# Patient Record
Sex: Female | Born: 1955 | Race: White | Hispanic: No | Marital: Married | Smoking: Never smoker
Health system: Southern US, Community
[De-identification: ages and names within clinical notes are randomized; demographics above are authoritative.]

## PROBLEM LIST (undated history)

## (undated) DIAGNOSIS — E7211 Homocystinuria: Secondary | ICD-10-CM

## (undated) HISTORY — PX: OTHER SURGICAL HISTORY: SHX169

## (undated) HISTORY — PX: SHOULDER SURGERY: SHX246

## (undated) HISTORY — DX: Homocystinuria: E72.11

## (undated) HISTORY — PX: TUBAL LIGATION: SHX77

---

## 2010-08-05 ENCOUNTER — Encounter: Payer: Self-pay | Admitting: Internal Medicine

## 2014-06-25 ENCOUNTER — Ambulatory Visit: Payer: Self-pay | Admitting: Cardiovascular Disease

## 2014-07-30 ENCOUNTER — Ambulatory Visit: Payer: Self-pay | Admitting: Cardiovascular Disease

## 2014-08-12 ENCOUNTER — Ambulatory Visit: Payer: Self-pay | Admitting: Cardiology

## 2015-04-29 DIAGNOSIS — M1712 Unilateral primary osteoarthritis, left knee: Secondary | ICD-10-CM | POA: Insufficient documentation

## 2016-02-19 ENCOUNTER — Other Ambulatory Visit: Payer: Self-pay | Admitting: Physical Medicine and Rehabilitation

## 2016-02-19 DIAGNOSIS — M7122 Synovial cyst of popliteal space [Baker], left knee: Secondary | ICD-10-CM

## 2016-02-19 DIAGNOSIS — M6240 Contracture of muscle, unspecified site: Secondary | ICD-10-CM

## 2016-02-24 ENCOUNTER — Other Ambulatory Visit: Payer: Self-pay

## 2016-03-02 ENCOUNTER — Ambulatory Visit
Admission: RE | Admit: 2016-03-02 | Discharge: 2016-03-02 | Disposition: A | Discharge status: Home / Self Care | Payer: Managed Care, Other (non HMO) | Source: Ambulatory Visit | Attending: Physical Medicine and Rehabilitation | Admitting: Physical Medicine and Rehabilitation

## 2016-03-02 DIAGNOSIS — M7122 Synovial cyst of popliteal space [Baker], left knee: Secondary | ICD-10-CM

## 2016-03-02 DIAGNOSIS — M6240 Contracture of muscle, unspecified site: Secondary | ICD-10-CM

## 2016-03-18 DIAGNOSIS — J309 Allergic rhinitis, unspecified: Secondary | ICD-10-CM | POA: Insufficient documentation

## 2016-03-18 DIAGNOSIS — H905 Unspecified sensorineural hearing loss: Secondary | ICD-10-CM | POA: Insufficient documentation

## 2016-03-18 DIAGNOSIS — H6980 Other specified disorders of Eustachian tube, unspecified ear: Secondary | ICD-10-CM | POA: Insufficient documentation

## 2017-01-07 DIAGNOSIS — J40 Bronchitis, not specified as acute or chronic: Secondary | ICD-10-CM | POA: Insufficient documentation

## 2017-01-07 DIAGNOSIS — K219 Gastro-esophageal reflux disease without esophagitis: Secondary | ICD-10-CM | POA: Insufficient documentation

## 2018-02-25 DIAGNOSIS — J069 Acute upper respiratory infection, unspecified: Secondary | ICD-10-CM | POA: Insufficient documentation

## 2018-02-25 DIAGNOSIS — B9789 Other viral agents as the cause of diseases classified elsewhere: Secondary | ICD-10-CM

## 2018-10-02 ENCOUNTER — Ambulatory Visit: Payer: Managed Care, Other (non HMO) | Admitting: Podiatry

## 2018-12-04 ENCOUNTER — Ambulatory Visit (INDEPENDENT_AMBULATORY_CARE_PROVIDER_SITE_OTHER): Payer: Managed Care, Other (non HMO)

## 2018-12-04 ENCOUNTER — Other Ambulatory Visit: Payer: Self-pay | Admitting: Podiatry

## 2018-12-04 ENCOUNTER — Encounter: Payer: Self-pay | Admitting: Podiatry

## 2018-12-04 ENCOUNTER — Ambulatory Visit (INDEPENDENT_AMBULATORY_CARE_PROVIDER_SITE_OTHER): Payer: Managed Care, Other (non HMO) | Admitting: Podiatry

## 2018-12-04 VITALS — BP 137/76 | HR 64

## 2018-12-04 DIAGNOSIS — M7661 Achilles tendinitis, right leg: Secondary | ICD-10-CM

## 2018-12-04 DIAGNOSIS — M79671 Pain in right foot: Secondary | ICD-10-CM

## 2018-12-04 DIAGNOSIS — M79672 Pain in left foot: Secondary | ICD-10-CM

## 2018-12-04 DIAGNOSIS — M7662 Achilles tendinitis, left leg: Secondary | ICD-10-CM | POA: Diagnosis not present

## 2018-12-04 MED ORDER — PREDNISONE 10 MG PO TABS: ORAL_TABLET | ORAL | 0 refills | Status: DC

## 2018-12-04 MED ORDER — TRIAMCINOLONE ACETONIDE 10 MG/ML IJ SUSP: 10.0000 mg | Freq: Once | INTRAMUSCULAR | Status: AC

## 2018-12-04 NOTE — Patient Instructions (Signed)

## 2018-12-06 NOTE — Progress Notes (Signed)
Subjective:   Patient ID: Elizabeth Fuentes, female   DOB: 62 y.o.   MRN: [id number]   HPI Patient states she is developed a lot of pain in the back of the left heel and slightly in the right and does not remember specific injury.  States it is hard for her to be active with it and its been very sore and it is really only occurred over the last few weeks with again no history of injury.  Patient does not smoke and likes to be active   Review of Systems  All other systems reviewed and are negative.       Objective:  Physical Exam Vitals signs and nursing note reviewed.  Constitutional:      Appearance: She is well-developed.  Pulmonary:     Effort: Pulmonary effort is normal.  Musculoskeletal: Normal range of motion.  Skin:    General: Skin is warm.  Neurological:     Mental Status: She is alert.     Neurovascular status found to be intact muscle strength is adequate with inflammation pain of the medial side of the left heel at the insertional point of the Achilles tendon into the calcaneus.  There is no indications of muscle strength loss and the right one also has moderate discomfort but not to the same degree and patient was noted to have good digital perfusion and well oriented x3     Assessment:  Acute Achilles tendinitis left is most likely local but cannot rule out systemic cause with moderate pain in the right     Plan:  H&P condition reviewed and at this point I am get a do a careful injection after first explaining to patient the chances for rupture associated with it.  Patient wants injection and I did carefully inject the medial side keep it away from the central and lateral side with 3 mg dexamethasone Kenalog 5 mg Xylocaine and advised on reduced activity ice therapy.  Reappoint to recheck again in the next several weeks  X-rays indicate small spur formation with no indications of stress fracture or arthritis

## 2018-12-25 ENCOUNTER — Ambulatory Visit (INDEPENDENT_AMBULATORY_CARE_PROVIDER_SITE_OTHER): Payer: Managed Care, Other (non HMO) | Admitting: Podiatry

## 2018-12-25 ENCOUNTER — Encounter: Payer: Self-pay | Admitting: Podiatry

## 2018-12-25 DIAGNOSIS — M7661 Achilles tendinitis, right leg: Secondary | ICD-10-CM

## 2018-12-25 DIAGNOSIS — M7662 Achilles tendinitis, left leg: Secondary | ICD-10-CM | POA: Diagnosis not present

## 2018-12-26 ENCOUNTER — Telehealth: Payer: Self-pay | Admitting: Podiatry

## 2018-12-26 NOTE — Telephone Encounter (Signed)
Pt called about the orthotics she was casted for yesterday. She is asking to hold off for now due to insurance and deductible is very high. She does have a pair from several years ago and will try to wear them and see if they can help. I did explain that if there is an issue with them that she maybe able to see Liliane Channel and see if there is anything he can do for her or possibly refurbish them. But she would need an appt and if she wanted she maybe able to see Liliane Channel on the same day of her appt with Dr Paulla Dolly in February.

## 2018-12-27 NOTE — Progress Notes (Signed)
Subjective:   Patient ID: Elizabeth Fuentes, female   DOB: 63 y.o.   MRN: [id number]   HPI Patient presents stating I am moderately improved but I still having a lot of pain in my heel and it seems to be continuing on the left with mild pain in the right   ROS      Objective:  Physical Exam  Neurovascular status intact no changes in muscle strength with patient continue to exhibit discomfort posterior aspect heel medial side left over right at the insertion Achilles calcaneus     Assessment:  Continuing to exhibit moderate discomfort at the insertion of the Achilles into the calcaneus left over right with moderate equinus and flatfoot gait structure     Plan:  H&P discussed condition and discussed treatment options including surgical intervention shockwave therapy or continued conservative treatment.  She is opted for conservative and I recommended orthotics with heel lift to try to keep pressure off the heel and I recommended ice therapy and dispensed Achilles sleeve to try to take pressure off the posterior heel region.  Patient will be careful with her shoe gear choices and will be seen back to recheck

## 2019-01-31 ENCOUNTER — Ambulatory Visit: Payer: Managed Care, Other (non HMO) | Admitting: Podiatry

## 2020-02-16 LAB — COLOGUARD: COLOGUARD: NEGATIVE

## 2020-07-23 ENCOUNTER — Other Ambulatory Visit: Payer: Self-pay

## 2020-07-23 ENCOUNTER — Encounter: Payer: Self-pay | Admitting: Dermatology

## 2020-07-23 ENCOUNTER — Ambulatory Visit (INDEPENDENT_AMBULATORY_CARE_PROVIDER_SITE_OTHER): Payer: Managed Care, Other (non HMO) | Admitting: Dermatology

## 2020-07-23 DIAGNOSIS — D485 Neoplasm of uncertain behavior of skin: Secondary | ICD-10-CM | POA: Diagnosis not present

## 2020-07-23 DIAGNOSIS — D229 Melanocytic nevi, unspecified: Secondary | ICD-10-CM

## 2020-07-23 DIAGNOSIS — D225 Melanocytic nevi of trunk: Secondary | ICD-10-CM | POA: Diagnosis not present

## 2020-07-23 NOTE — Patient Instructions (Signed)

## 2020-09-06 ENCOUNTER — Encounter: Payer: Self-pay | Admitting: Dermatology

## 2020-09-06 NOTE — Progress Notes (Signed)
   New Patient   Subjective  Elizabeth Fuentes is a 64 y.o. female who presents for the following: New Patient (Initial Visit) (DOS 3+ years--skin check and face --discoloration, redness.).  Growth Location:  Duration:  Quality:  Associated Signs/Symptoms: Modifying Factors:  Severity:  Timing: Context:    The following portions of the chart were reviewed this encounter and updated as appropriate: Tobacco  Allergies  Meds  Problems  Med Hx  Surg Hx  Fam Hx      Objective  Well appearing patient in no apparent distress; mood and affect are within normal limits.  All sun exposed areas plus back examined. Plus chest.   Assessment & Plan  Neoplasm of uncertain behavior of skin Right Breast  Skin / nail biopsy Type of biopsy: tangential   Informed consent: discussed and consent obtained   Timeout: patient name, date of birth, surgical site, and procedure verified   Procedure prep:  Patient was prepped and draped in usual sterile fashion Prep type:  Chlorhexidine Anesthesia: the lesion was anesthetized in a standard fashion   Anesthetic:  1% lidocaine w/ epinephrine 1-100,000 local infiltration Instrument used: flexible razor blade   Hemostasis achieved with: ferric subsulfate   Outcome: patient tolerated procedure well   Post-procedure details: wound care instructions given    Specimen 1 - Surgical pathology Differential Diagnosis: r/o wart, tag Check Margins: No  Nevus Mid Back  Annual skin examination

## 2020-10-06 ENCOUNTER — Telehealth: Payer: Self-pay | Admitting: Dermatology

## 2020-10-06 NOTE — Telephone Encounter (Signed)
Says on August 4 ST removed a wart and the nurse wadded it up and threw it in the trash but she was charged for a biopsy.

## 2020-10-07 NOTE — Telephone Encounter (Signed)
Patient aware results and specimen was sent to the lab. She thought it was just snipped and no sent I advised patient when it is a biopsy we get photo consent &  it is sent to the lab. Patient understands

## 2020-10-09 ENCOUNTER — Encounter: Payer: Self-pay | Admitting: Internal Medicine

## 2020-10-09 ENCOUNTER — Ambulatory Visit (INDEPENDENT_AMBULATORY_CARE_PROVIDER_SITE_OTHER): Payer: Managed Care, Other (non HMO) | Admitting: Internal Medicine

## 2020-10-09 ENCOUNTER — Other Ambulatory Visit: Payer: Self-pay

## 2020-10-09 VITALS — BP 128/82 | HR 61 | Ht 62.0 in | Wt 158.4 lb

## 2020-10-09 DIAGNOSIS — Z8249 Family history of ischemic heart disease and other diseases of the circulatory system: Secondary | ICD-10-CM

## 2020-10-09 DIAGNOSIS — Z7189 Other specified counseling: Secondary | ICD-10-CM | POA: Diagnosis not present

## 2020-10-09 NOTE — Patient Instructions (Signed)
Medication Instructions:  No Changes In Medications at this time.  *If you need a refill on your cardiac medications before your next appointment, please call your pharmacy*  Lab Work: None Ordered At This Time.  If you have labs (blood work) drawn today and your tests are completely normal, you will receive your results only by: Marland Kitchen MyChart Message (if you have MyChart) OR . A paper copy in the mail If you have any lab test that is abnormal or we need to change your treatment, we will call you to review the results.  Testing/Procedures: Dr. Margaretann Loveless has ordered a CT coronary calcium score. This test is done at [address]. This is $150 out of pocket.   Coronary CalciumScan A coronary calcium scan is an imaging test used to look for deposits of calcium and other fatty materials (plaques) in the inner lining of the blood vessels of the heart (coronary arteries). These deposits of calcium and plaques can partly clog and narrow the coronary arteries without producing any symptoms or warning signs. This puts a person at risk for a heart attack. This test can detect these deposits before symptoms develop. Tell a health care provider about:  Any allergies you have.  All medicines you are taking, including vitamins, herbs, eye drops, creams, and over-the-counter medicines.  Any problems you or family members have had with anesthetic medicines.  Any blood disorders you have.  Any surgeries you have had.  Any medical conditions you have.  Whether you are pregnant or may be pregnant. What are the risks? Generally, this is a safe procedure. However, problems may occur, including:  Harm to a pregnant woman and her unborn baby. This test involves the use of radiation. Radiation exposure can be dangerous to a pregnant woman and her unborn baby. If you are pregnant, you generally should not have this procedure done.  Slight increase in the risk of cancer. This is because of the  radiation involved in the test. What happens before the procedure? No preparation is needed for this procedure. What happens during the procedure?  You will undress and remove any jewelry around your neck or chest.  You will put on a hospital gown.  Sticky electrodes will be placed on your chest. The electrodes will be connected to an electrocardiogram (ECG) machine to record a tracing of the electrical activity of your heart.  A CT scanner will take pictures of your heart. During this time, you will be asked to lie still and hold your breath for 2-3 seconds while a picture of your heart is being taken. The procedure may vary among health care providers and hospitals. What happens after the procedure?  You can get dressed.  You can return to your normal activities.  It is up to you to get the results of your test. Ask your health care provider, or the department that is doing the test, when your results will be ready. Summary  A coronary calcium scan is an imaging test used to look for deposits of calcium and other fatty materials (plaques) in the inner lining of the blood vessels of the heart (coronary arteries).  Generally, this is a safe procedure. Tell your health care provider if you are pregnant or may be pregnant.  No preparation is needed for this procedure.  A CT scanner will take pictures of your heart.  You can return to your normal activities after the scan is done. This information is not intended to replace  advice given to you by your health care provider. Make sure you discuss any questions you have with your health care provider. Document Released: 06/03/2008 Document Revised: 10/25/2016 Document Reviewed: 10/25/2016 Elsevier Interactive Patient Education  2017 [place]: At [facility], you and your health needs are our priority.  As part of our continuing mission to provide you with exceptional heart care, we have created designated Provider  Care Teams.  These Care Teams include your primary Cardiologist (physician) and Advanced Practice Providers (APPs -  Physician Assistants and Nurse Practitioners) who all work together to provide you with the care you need, when you need it.  We recommend signing up for the patient portal called "MyChart".  Sign up information is provided on this After Visit Summary.  MyChart is used to connect with patients for Virtual Visits (Telemedicine).  Patients are able to view lab/test results, encounter notes, upcoming appointments, etc.  Non-urgent messages can be sent to your provider as well.   To learn more about what you can do with MyChart, go to [email or web address].    Your next appointment:   1 month(s)  The format for your next appointment:   In Person  Provider:   Cherlynn Kaiser, MD

## 2020-10-09 NOTE — Progress Notes (Signed)
Cardiology Office Note:    Date:  10/09/2020   ID:  Elizabeth Fuentes, DOB 10-23-1956, MRN [id number]  PCP:  Nickola Major, MD  Cardiologist:  No primary care provider on file.  Electrophysiologist:  None   Referring MD: Brien Few, MD   Chief Complaint/Reason for Referral: Family history coronary artery disease  History of Present Illness:    Elizabeth Fuentes is a 64 y.o. female with a history of chest pressure with normal stress nuclear study with excellent exercise capacity in 2004 and 2011, and strong family history of coronary artery disease who presents for risk stratification. Last seen by cardiology 2011.  She denies chest pain, chest pressure, dyspnea at rest or with exertion, palpitations, PND, orthopnea, or leg swelling. Denies cough, fever, chills. Denies nausea, vomiting. Denies syncope or presyncope. Denies dizziness or lightheadedness. Denies snoring.  BP noted as elevated initially but usually runs 118/70-80, and on repeat in office was normal.  FHx: Father MI 69.  Sisters afib Oldest brother aorta replaced. MIs before. Smoker  Past Medical History:  Diagnosis Date  . 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 removal kidney stone    . SHOULDER SURGERY    . TUBAL LIGATION      Current Medications: Current Meds  Medication Sig  . Turmeric 400 MG CAPS Take by mouth.   Current Facility-Administered Medications for the 10/09/20 encounter (Office Visit) with Elouise Munroe, MD  Medication  . triamcinolone acetonide (KENALOG) 10 MG/ML injection 10 mg     Allergies:   Sulfamethoxazole   Social History   Tobacco Use  . Smoking status: Never Smoker  . Smokeless tobacco: Never Used  Substance Use Topics  . Alcohol use: Never  . Drug use: Never     Family History: The patient's family history is not on file.  ROS:   Please see the history of present illness.    All other systems reviewed and are  negative.  EKGs/Labs/Other Studies Reviewed:    The following studies were reviewed today:  EKG:  NSR  Recent Labs: No results found for requested labs within last 8760 hours.  Recent Lipid Panel No results found for: CHOL, TRIG, HDL, CHOLHDL, VLDL, LDLCALC, LDLDIRECT  Physical Exam:    VS:  BP 128/82   Pulse 61   Ht 5\' 2"  (1.575 m)   Wt 158 lb 6.4 oz (71.8 kg)   BMI 28.97 kg/m     Wt Readings from Last 5 Encounters:     10/09/20 158 lb 6.4 oz (71.8 kg)    Constitutional: No acute distress Eyes: sclera non-icteric, normal conjunctiva and lids ENMT: normal dentition, moist mucous membranes Cardiovascular: regular rhythm, normal rate, no murmurs. S1 and S2 normal. Radial pulses normal bilaterally. No jugular venous distention.  Respiratory: clear to auscultation bilaterally GI : normal bowel sounds, soft and nontender. No distention.   MSK: extremities warm, well perfused. No edema.  NEURO: grossly nonfocal exam, moves all extremities. PSYCH: alert and oriented x 3, normal mood and affect.   ASSESSMENT:    1. Family history of heart disease   2. Cardiac risk counseling   3. Family history of ASCVD    PLAN:    Family history of heart disease - Plan: CT CARDIAC SCORING, EKG 12-Lead  Cardiac risk counseling  Family history of ASCVD   We will initiate screening with a coronary calcium scan. LDL is 92 on last check in 2019, follows with  PCP. If calcium score negative, recommend aggressive risk factor modification, her current ASCVD risk is 5.2% which is borderline. Discussed healthy lifestyle and recommend moderate daily exercise, goal of 150 minutes per week per AHA guidelines.  Total time of encounter: 40 minutes total time of encounter, including 25 minutes spent in face-to-face patient care on the date of this encounter. This time includes coordination of care and counseling regarding above mentioned problem list. Remainder of non-face-to-face time involved reviewing  chart documents/testing relevant to the patient encounter and documentation in the medical record. I have independently reviewed documentation from referring provider.   Cherlynn Kaiser, MD [place]  [facility]    Medication Adjustments/Labs and Tests Ordered: Current medicines are reviewed at length with the patient today.  Concerns regarding medicines are outlined above.   Orders Placed This Encounter  Procedures  . CT CARDIAC SCORING  . EKG 12-Lead    No orders of the defined types were placed in this encounter.   Patient Instructions  Medication Instructions:  No Changes In Medications at this time.  *If you need a refill on your cardiac medications before your next appointment, please call your pharmacy*  Lab Work: None Ordered At This Time.  If you have labs (blood work) drawn today and your tests are completely normal, you will receive your results only by: Marland Kitchen MyChart Message (if you have MyChart) OR . A paper copy in the mail If you have any lab test that is abnormal or we need to change your treatment, we will call you to review the results.  Testing/Procedures: Dr. Margaretann Loveless has ordered a CT coronary calcium score. This test is done at [address]. This is $150 out of pocket.   Coronary CalciumScan A coronary calcium scan is an imaging test used to look for deposits of calcium and other fatty materials (plaques) in the inner lining of the blood vessels of the heart (coronary arteries). These deposits of calcium and plaques can partly clog and narrow the coronary arteries without producing any symptoms or warning signs. This puts a person at risk for a heart attack. This test can detect these deposits before symptoms develop. Tell a health care provider about:  Any allergies you have.  All medicines you are taking, including vitamins, herbs, eye drops, creams, and over-the-counter medicines.  Any problems you or family members have had with  anesthetic medicines.  Any blood disorders you have.  Any surgeries you have had.  Any medical conditions you have.  Whether you are pregnant or may be pregnant. What are the risks? Generally, this is a safe procedure. However, problems may occur, including:  Harm to a pregnant woman and her unborn baby. This test involves the use of radiation. Radiation exposure can be dangerous to a pregnant woman and her unborn baby. If you are pregnant, you generally should not have this procedure done.  Slight increase in the risk of cancer. This is because of the radiation involved in the test. What happens before the procedure? No preparation is needed for this procedure. What happens during the procedure?  You will undress and remove any jewelry around your neck or chest.  You will put on a hospital gown.  Sticky electrodes will be placed on your chest. The electrodes will be connected to an electrocardiogram (ECG) machine to record a tracing of the electrical activity of your heart.  A CT scanner will take pictures of your heart. During this time, you will be  asked to lie still and hold your breath for 2-3 seconds while a picture of your heart is being taken. The procedure may vary among health care providers and hospitals. What happens after the procedure?  You can get dressed.  You can return to your normal activities.  It is up to you to get the results of your test. Ask your health care provider, or the department that is doing the test, when your results will be ready. Summary  A coronary calcium scan is an imaging test used to look for deposits of calcium and other fatty materials (plaques) in the inner lining of the blood vessels of the heart (coronary arteries).  Generally, this is a safe procedure. Tell your health care provider if you are pregnant or may be pregnant.  No preparation is needed for this procedure.  A CT scanner will take pictures of your heart.  You can  return to your normal activities after the scan is done. This information is not intended to replace advice given to you by your health care provider. Make sure you discuss any questions you have with your health care provider. Document Released: 06/03/2008 Document Revised: 10/25/2016 Document Reviewed: 10/25/2016 Elsevier Interactive Patient Education  2017 [place]: At [facility], you and your health needs are our priority.  As part of our continuing mission to provide you with exceptional heart care, we have created designated Provider Care Teams.  These Care Teams include your primary Cardiologist (physician) and Advanced Practice Providers (APPs -  Physician Assistants and Nurse Practitioners) who all work together to provide you with the care you need, when you need it.  We recommend signing up for the patient portal called "MyChart".  Sign up information is provided on this After Visit Summary.  MyChart is used to connect with patients for Virtual Visits (Telemedicine).  Patients are able to view lab/test results, encounter notes, upcoming appointments, etc.  Non-urgent messages can be sent to your provider as well.   To learn more about what you can do with MyChart, go to [email or web address].    Your next appointment:   1 month(s)  The format for your next appointment:   In Person  Provider:   Cherlynn Kaiser, MD

## 2020-10-28 ENCOUNTER — Ambulatory Visit (INDEPENDENT_AMBULATORY_CARE_PROVIDER_SITE_OTHER)
Admission: RE | Admit: 2020-10-28 | Discharge: 2020-10-28 | Disposition: A | Discharge status: Home / Self Care | Payer: Self-pay | Source: Ambulatory Visit | Attending: Internal Medicine | Admitting: Internal Medicine

## 2020-10-28 ENCOUNTER — Other Ambulatory Visit: Payer: Self-pay

## 2020-10-28 DIAGNOSIS — Z8249 Family history of ischemic heart disease and other diseases of the circulatory system: Secondary | ICD-10-CM

## 2020-11-05 ENCOUNTER — Encounter: Payer: Self-pay | Admitting: Internal Medicine

## 2020-11-05 ENCOUNTER — Ambulatory Visit (INDEPENDENT_AMBULATORY_CARE_PROVIDER_SITE_OTHER): Payer: Managed Care, Other (non HMO) | Admitting: Internal Medicine

## 2020-11-05 ENCOUNTER — Ambulatory Visit: Payer: Managed Care, Other (non HMO) | Admitting: Internal Medicine

## 2020-11-05 ENCOUNTER — Other Ambulatory Visit: Payer: Self-pay

## 2020-11-05 VITALS — BP 138/82 | HR 75 | Ht 62.0 in | Wt 159.6 lb

## 2020-11-05 DIAGNOSIS — Z7189 Other specified counseling: Secondary | ICD-10-CM | POA: Diagnosis not present

## 2020-11-05 DIAGNOSIS — Z8249 Family history of ischemic heart disease and other diseases of the circulatory system: Secondary | ICD-10-CM | POA: Diagnosis not present

## 2020-11-05 NOTE — Progress Notes (Signed)
Cardiology Office Note:    Date:  11/05/2020   ID:  Elizabeth Fuentes, DOB 11/09/1956, MRN [id number]  PCP:  Nickola Major, MD  Cardiologist:  No primary care provider on file.  Electrophysiologist:  None   Referring MD: Nickola Major, MD   Chief Complaint/Reason for Referral: Follow up Fhx of CAD  History of Present Illness:    Elizabeth Fuentes is a 64 y.o. female with a history of history of chest pressure with normal stress nuclear study with excellent exercise capacity in 2004 and 2011, and strong family history of coronary artery disease who presents for risk stratification. Last seen 10/09/20 by me for initial consultation.  No cp.  No sob  Brother had aorta replacement, recovering slowly.   Discussed results of Cor Cal CT, no coronary calcium and normal size of visualized aorta. Tiny bilateral pulmonary nodules noted, she is low risk and no follow up imaging is required by Fleischner criteria. She had covid in January '21, she wonders if this is related. Unclear given no prior imaging to review. Recommend monitoring clinically.     Past Medical History:  Diagnosis Date  . 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 removal kidney stone    . SHOULDER SURGERY    . TUBAL LIGATION      Current Medications: Current Meds  Medication Sig  . Turmeric 400 MG CAPS Take by mouth.   Current Facility-Administered Medications for the 11/05/20 encounter (Office Visit) with Elouise Munroe, MD  Medication  . triamcinolone acetonide (KENALOG) 10 MG/ML injection 10 mg     Allergies:   Sulfamethoxazole   Social History   Tobacco Use  . Smoking status: Never Smoker  . Smokeless tobacco: Never Used  Substance Use Topics  . Alcohol use: Never  . Drug use: Never     Family History: The patient's family history is not on file.  ROS:   Please see the history of present illness.    All other systems reviewed and are  negative.  EKGs/Labs/Other Studies Reviewed:    The following studies were reviewed today:   Recent Labs: No results found for requested labs within last 8760 hours.  Recent Lipid Panel No results found for: CHOL, TRIG, HDL, CHOLHDL, VLDL, LDLCALC, LDLDIRECT  Physical Exam:    VS:  BP 138/82   Pulse 75   Ht 5\' 2"  (1.575 m)   Wt 159 lb 9.6 oz (72.4 kg)   SpO2 100%   BMI 29.19 kg/m     Wt Readings from Last 5 Encounters:  11/05/20 159 lb 9.6 oz (72.4 kg)  10/09/20 158 lb 6.4 oz (71.8 kg)    Constitutional: No acute distress Eyes: sclera non-icteric, normal conjunctiva and lids ENMT: normal dentition, moist mucous membranes Cardiovascular: regular rhythm, normal rate, no murmurs. S1 and S2 normal. Radial pulses normal bilaterally. No jugular venous distention.  Respiratory: clear to auscultation bilaterally GI : normal bowel sounds, soft and nontender. No distention.   MSK: extremities warm, well perfused. No edema.  NEURO: grossly nonfocal exam, moves all extremities. PSYCH: alert and oriented x 3, normal mood and affect.   ASSESSMENT:    1. Family history of ASCVD   2. Cardiac risk counseling    PLAN:    Family history of ASCVD  Cardiac risk counseling   No coronary calcifications. Recommend aggressive risk reduction with lifestyle recommendations as follows below. No current strong indication for lipid lowering therapy, would  aim for goal LDL < 70. ASCVD risk is 5.2%.  Exercise recommendations: Goal of exercising for at least 30 minutes a day, at least 5 times per week.  Please exercise to a moderate exertion.  This means that while exercising it is difficult to speak in full sentences, however you are not so short of breath that you feel you must stop, and not so comfortable that you can carry on a full conversation.  Exertion level should be approximately a 5/10, if 10 is the most exertion you can perform.  Diet recommendations: Recommend a heart healthy diet  such as the Mediterranean diet.  This diet consists of plant based foods, healthy fats, lean meats, olive oil.  It suggests limiting the intake of simple carbohydrates such as white breads, pastries, and pastas.  It also limits the amount of red meat, wine, and dairy products such as cheese that one should consume on a daily basis.   Total time of encounter: 21 minutes total time of encounter, including 15 minutes spent in face-to-face patient care on the date of this encounter. This time includes coordination of care and counseling regarding above mentioned problem list. Remainder of non-face-to-face time involved reviewing chart documents/testing relevant to the patient encounter and documentation in the medical record. I have independently reviewed documentation from referring provider.   Cherlynn Kaiser, MD [place]  [facility]    Medication Adjustments/Labs and Tests Ordered: Current medicines are reviewed at length with the patient today.  Concerns regarding medicines are outlined above.   No orders of the defined types were placed in this encounter.   No orders of the defined types were placed in this encounter.   Patient Instructions  Medication Instructions:  No Changes In Medications at this time.  *If you need a refill on your cardiac medications before your next appointment, please call your pharmacy*  Lab Work: None Ordered At This Time.  If you have labs (blood work) drawn today and your tests are completely normal, you will receive your results only by: Marland Kitchen MyChart Message (if you have MyChart) OR . A paper copy in the mail If you have any lab test that is abnormal or we need to change your treatment, we will call you to review the results.  Testing/Procedures: None Ordered At This Time.   Follow-Up: At [facility], you and your health needs are our priority.  As part of our continuing mission to provide you with exceptional heart care, we have created  designated Provider Care Teams.  These Care Teams include your primary Cardiologist (physician) and Advanced Practice Providers (APPs -  Physician Assistants and Nurse Practitioners) who all work together to provide you with the care you need, when you need it.  We recommend signing up for the patient portal called "MyChart".  Sign up information is provided on this After Visit Summary.  MyChart is used to connect with patients for Virtual Visits (Telemedicine).  Patients are able to view lab/test results, encounter notes, upcoming appointments, etc.  Non-urgent messages can be sent to your provider as well.   To learn more about what you can do with MyChart, go to [email or web address].    Your next appointment:   1 year(s)  The format for your next appointment:   In Person  Provider:   Cherlynn Kaiser, MD

## 2020-11-05 NOTE — Patient Instructions (Signed)
Medication Instructions:  No Changes In Medications at this time.  *If you need a refill on your cardiac medications before your next appointment, please call your pharmacy*  Lab Work: None Ordered At This Time.  If you have labs (blood work) drawn today and your tests are completely normal, you will receive your results only by: . MyChart Message (if you have MyChart) OR . A paper copy in the mail If you have any lab test that is abnormal or we need to change your treatment, we will call you to review the results.  Testing/Procedures: None Ordered At This Time.   Follow-Up: At [facility], you and your health needs are our priority.  As part of our continuing mission to provide you with exceptional heart care, we have created designated Provider Care Teams.  These Care Teams include your primary Cardiologist (physician) and Advanced Practice Providers (APPs -  Physician Assistants and Nurse Practitioners) who all work together to provide you with the care you need, when you need it.  We recommend signing up for the patient portal called "MyChart".  Sign up information is provided on this After Visit Summary.  MyChart is used to connect with patients for Virtual Visits (Telemedicine).  Patients are able to view lab/test results, encounter notes, upcoming appointments, etc.  Non-urgent messages can be sent to your provider as well.   To learn more about what you can do with MyChart, go to [email or web address].    Your next appointment:   1 year(s)  The format for your next appointment:   In Person  Provider:   Gayatri Acharya, MD   

## 2021-05-19 ENCOUNTER — Encounter: Payer: Self-pay | Admitting: Physician Assistant

## 2021-05-19 ENCOUNTER — Ambulatory Visit (INDEPENDENT_AMBULATORY_CARE_PROVIDER_SITE_OTHER): Payer: Managed Care, Other (non HMO) | Admitting: Physician Assistant

## 2021-05-19 ENCOUNTER — Other Ambulatory Visit: Payer: Self-pay

## 2021-05-19 DIAGNOSIS — D485 Neoplasm of uncertain behavior of skin: Secondary | ICD-10-CM

## 2021-05-19 DIAGNOSIS — L820 Inflamed seborrheic keratosis: Secondary | ICD-10-CM

## 2021-05-19 NOTE — Progress Notes (Signed)
   Follow-Up Visit   Subjective  Elizabeth Fuentes is a 65 y.o. female who presents for the following: Skin Problem (Here for new lesion x 4 months- seems to be growing/changing.  No personal  or family history of melanoma or non mole skin cancer. ).   The following portions of the chart were reviewed this encounter and updated as appropriate:  Tobacco  Allergies  Meds  Problems  Med Hx  Surg Hx  Fam Hx         Objective  Well appearing patient in no apparent distress; mood and affect are within normal limits.  A focused examination was performed including face. Relevant physical exam findings are noted in the Assessment and Plan.  Objective  Mid Forehead: Crusty brown plaque      Assessment & Plan  Neoplasm of uncertain behavior of skin Mid Forehead  Skin / nail biopsy Type of biopsy: tangential   Informed consent: discussed and consent obtained   Timeout: patient name, date of birth, surgical site, and procedure verified   Procedure prep:  Patient was prepped and draped in usual sterile fashion (Non sterile) Prep type:  Chlorhexidine Anesthesia: the lesion was anesthetized in a standard fashion   Anesthetic:  1% lidocaine w/ epinephrine 1-100,000 local infiltration Instrument used: flexible razor blade   Outcome: patient tolerated procedure well   Post-procedure details: wound care instructions given   Additional details:  Cautery only  Specimen 1 - Surgical pathology Differential Diagnosis: r/o sk  Check Margins: No    I, Aide Wojnar, PA-C, have reviewed all documentation's for this visit.  The documentation on 05/19/21 for the exam, diagnosis, procedures and orders are all accurate and complete.

## 2021-05-19 NOTE — Patient Instructions (Signed)

## 2022-07-15 IMAGING — CT CT CARDIAC CORONARY ARTERY CALCIUM SCORE
3 series · 14 of 20 positions shown, 15 images · non-contrast
Comparison: None.
COMPARISON: None.

Addendum:
EXAM:
OVER-READ INTERPRETATION  CT CHEST

The following report is an over-read performed by radiologist Dr.
Kiri Jim [REDACTED] on 10/28/2020. This over-read
does not include interpretation of cardiac or coronary anatomy or
pathology. The calcium score interpretation by the cardiologist is
attached.
CLINICAL DATA: Risk stratification, CAD screening, 10yr CHD risk
10-20%, treadmill candidate
Coronary Calcium Score
TECHNIQUE: The patient was scanned on a Siemens Force scanner. Axial
non-contrast 3 mm slices were carried out through the heart. The
data set was analyzed on a dedicated work station and scored using
the Agatston method.

[Series 2: casc 3.0 bv41 2 bestdiast 73 % · axial · 0.36mm/px · z∈[-227,-164]mm · 4 of 37 slices shown, 5 images]
[im 8/37  vessel]
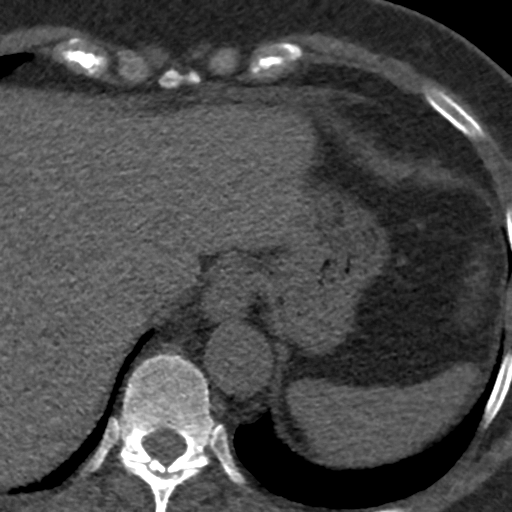
[im 8/37  lung]
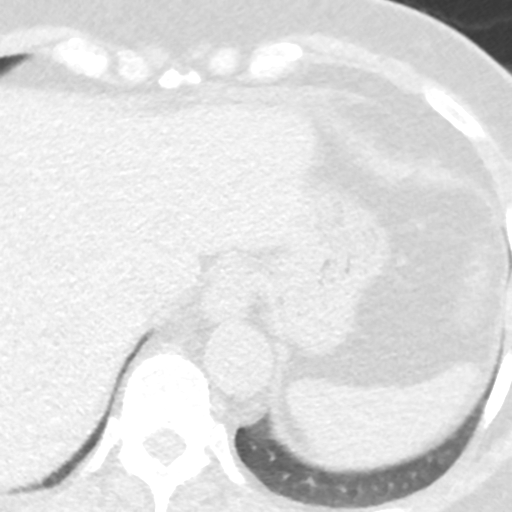
[im 15/37  vessel]
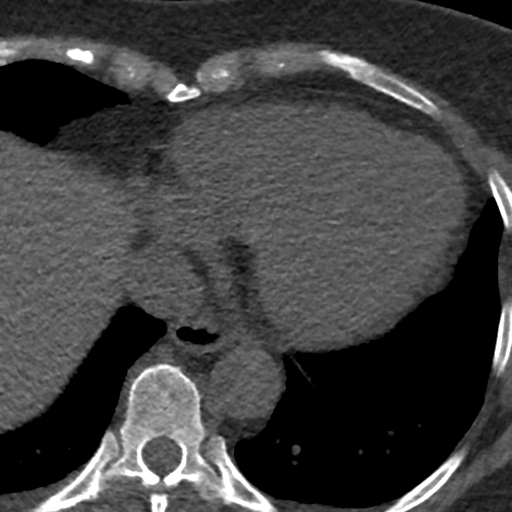
[im 22/37  vessel]
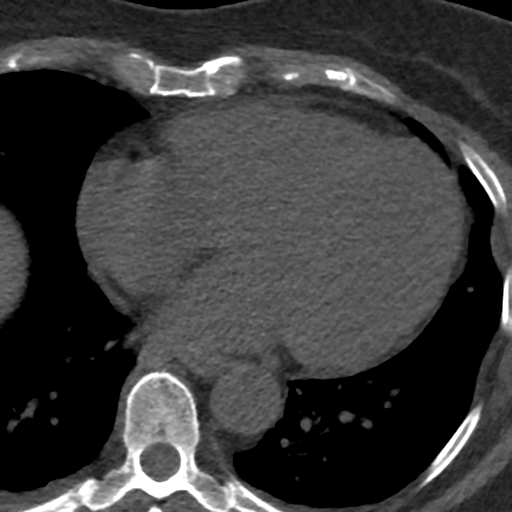
[im 29/37  vessel]
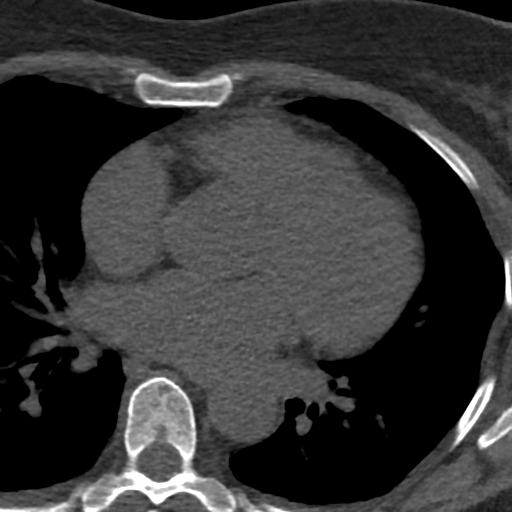

[Series 3: lung 74 % · axial · 0.65mm/px · z∈[-230,-158]mm · 5 of 37 slices shown]
[im 7/37  lung]
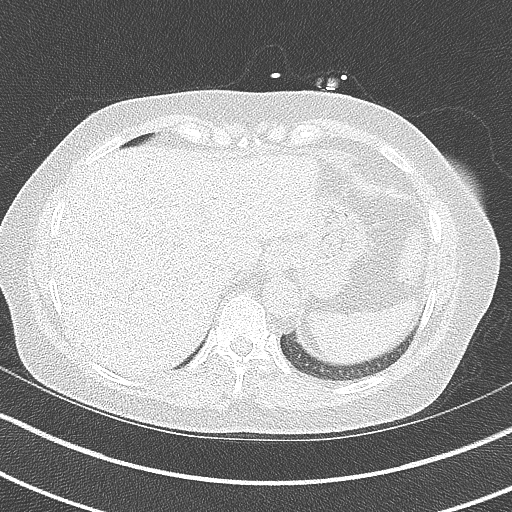
[im 13/37  lung]
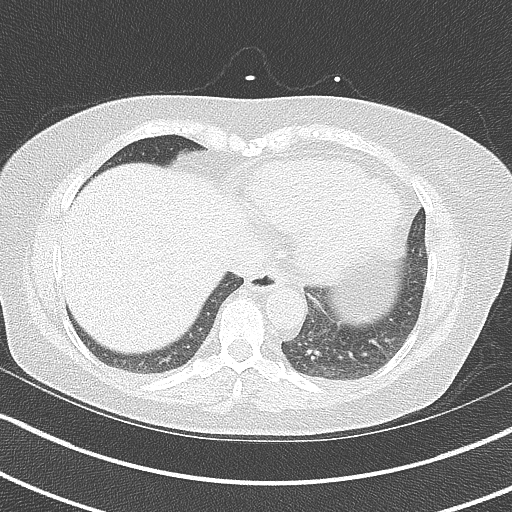
[im 19/37  lung]
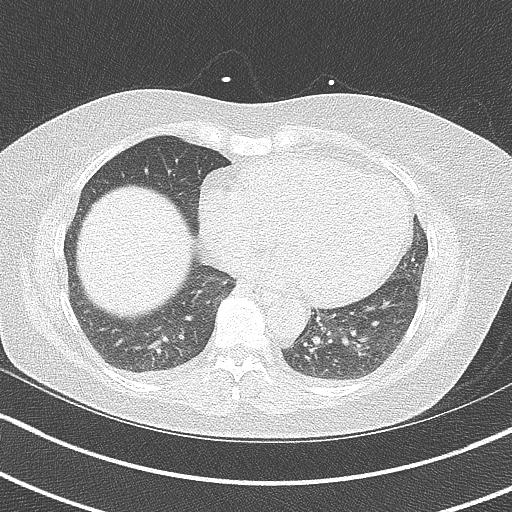
[im 25/37  lung]
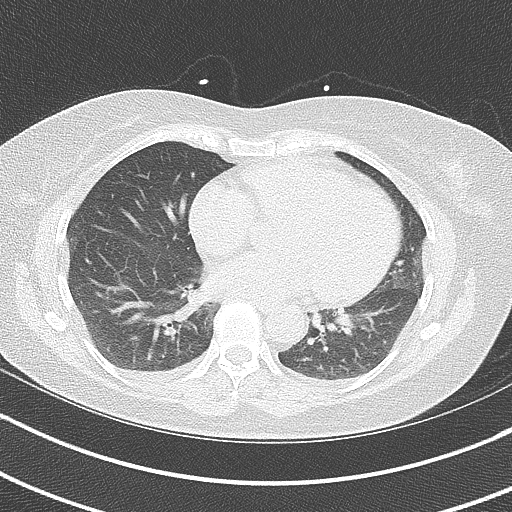
[im 31/37  lung]
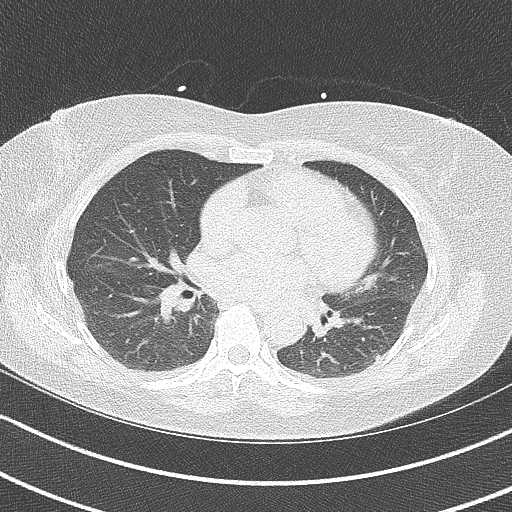

[Series 4: lung st 74 % · axial · 0.65mm/px · z∈[-230,-158]mm · 5 of 37 slices shown]
[im 7/37  lung]
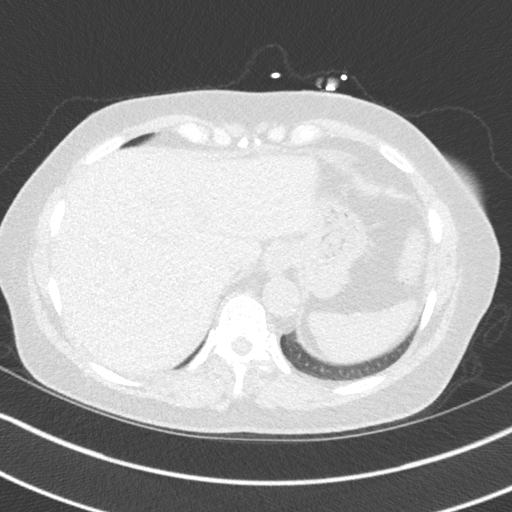
[im 13/37  lung]
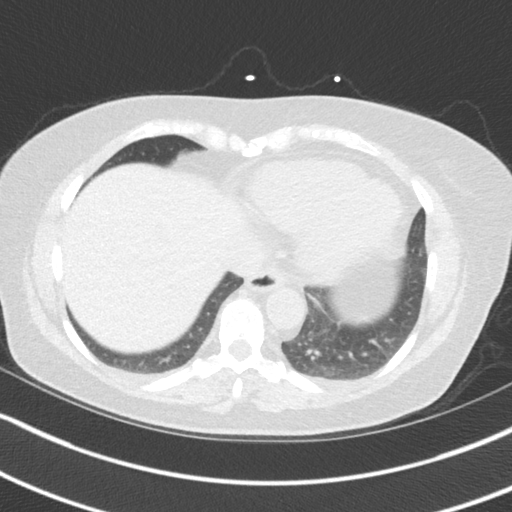
[im 19/37  lung]
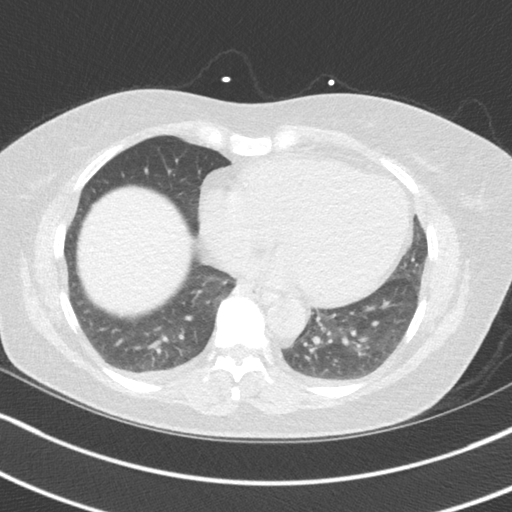
[im 25/37  lung]
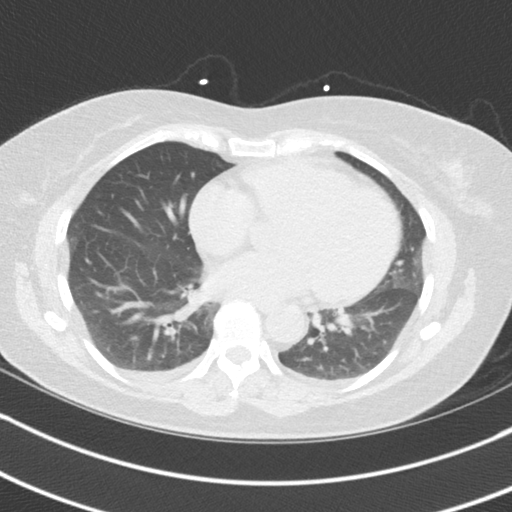
[im 31/37  lung]
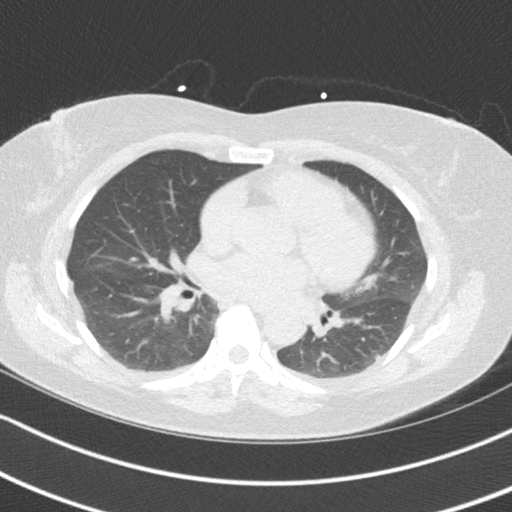

[14 of 20 positions shown; findings below may reference images not displayed]

FINDINGS: Vascular: Normal aortic caliber.

Mediastinum/Nodes: No imaged thoracic adenopathy.

Lungs/Pleura: Trace bilateral pleural fluid, possibly physiologic. 4
mm anterior right lung base (anterior right lower lobe) subpleural
pulmonary nodule on [DATE].

A subpleural left lower lobe 3 mm nodule on [DATE].

Upper Abdomen: Normal imaged portions of the liver, spleen, stomach.

Musculoskeletal: No acute osseous abnormality.
IMPRESSION: 1. No acute process in the imaged chest.
2. Tiny bilateral pulmonary nodules, possibly subpleural lymph
nodes. No follow-up needed if patient is low-risk. Non-contrast
chest CT can be considered in 12 months if patient is high-risk.
This recommendation follows the consensus statement: Guidelines for
Management of Incidental Pulmonary Nodules Detected on CT Images:
3. Trace bilateral pleural fluid, possibly physiologic.
FINDINGS: Non-cardiac: See separate report from [REDACTED].

Ascending Aorta: 32 mm at the mid ascending aorta measured in an
axial plane.

Pericardium: Normal

Coronary arteries:

Coronary calcium score of 0.
IMPRESSION: Coronary calcium score of 0.

*** End of Addendum ***
EXAM:
OVER-READ INTERPRETATION  CT CHEST

The following report is an over-read performed by radiologist Dr.
Kiri Jim [REDACTED] on 10/28/2020. This over-read
does not include interpretation of cardiac or coronary anatomy or
pathology. The calcium score interpretation by the cardiologist is
attached.
FINDINGS: Vascular: Normal aortic caliber.

Mediastinum/Nodes: No imaged thoracic adenopathy.

Lungs/Pleura: Trace bilateral pleural fluid, possibly physiologic. 4
mm anterior right lung base (anterior right lower lobe) subpleural
pulmonary nodule on [DATE].

A subpleural left lower lobe 3 mm nodule on [DATE].

Upper Abdomen: Normal imaged portions of the liver, spleen, stomach.

Musculoskeletal: No acute osseous abnormality.
IMPRESSION: 1. No acute process in the imaged chest.
2. Tiny bilateral pulmonary nodules, possibly subpleural lymph
nodes. No follow-up needed if patient is low-risk. Non-contrast
chest CT can be considered in 12 months if patient is high-risk.
This recommendation follows the consensus statement: Guidelines for
Management of Incidental Pulmonary Nodules Detected on CT Images:
3. Trace bilateral pleural fluid, possibly physiologic.

## 2022-08-06 ENCOUNTER — Telehealth: Payer: Self-pay | Admitting: Internal Medicine

## 2022-08-06 NOTE — Telephone Encounter (Signed)
Pt states lately she has been having pressure in her chest like indigestion. She states it is not painful. She states she exercises daily and doesn't take any meds. She states this is a little concerning.

## 2022-08-06 NOTE — Telephone Encounter (Signed)
Attempted phone call to pt and left voicemail message to contact triage at [phone]. 

## 2022-08-06 NOTE — Telephone Encounter (Signed)
Spoke with pt who reports she developed some chest discomfort/heaviness while driving Sunday night.  Pt states she took a few deep breaths and discomfort resolved.  She reports she was awakened from her sleep last night around 3 am with a similar feeling.  She adjusted her sleep position and took a few deep breaths and again heaviness resolved.  She is not sure if she was dreaming last night or episodes might be more like indigestion.  Pt denies current CP, SOB or dizziness.  She states she walks 3 miles daily and works with a trainer a few times weekly.  Denies any SOB or pain on exertion.  She does not routinely monitor her BP and HR at home.  Pt is scheduled for an OV on 09/23/2022 with Dr Margaretann Loveless.  She states PCP monitors lipids and labs have been good. Pt's calcium score 10/2020 was 0.  Pt advised to monitor BP and HR at home a couple of times weekly.  If discomfort returns to contact office to move up appointment.  Reviewed ED precautions with pt who verbalizes understanding and agrees with current plan.

## 2022-09-23 ENCOUNTER — Encounter: Payer: Self-pay | Admitting: Internal Medicine

## 2022-09-23 ENCOUNTER — Ambulatory Visit: Payer: PPO | Attending: Internal Medicine | Admitting: Internal Medicine

## 2022-09-23 VITALS — BP 115/58 | HR 79 | Ht 62.0 in | Wt 160.0 lb

## 2022-09-23 DIAGNOSIS — R0789 Other chest pain: Secondary | ICD-10-CM

## 2022-09-23 DIAGNOSIS — R0683 Snoring: Secondary | ICD-10-CM | POA: Diagnosis not present

## 2022-09-23 NOTE — Patient Instructions (Signed)
Medication Instructions:  No Changes In Medications at this time.  *If you need a refill on your cardiac medications before your next appointment, please call your pharmacy*  Lab Work: None Ordered At This Time.  If you have labs (blood work) drawn today and your tests are completely normal, you will receive your results only by: [facility] (if you have MyChart) OR A paper copy in the mail If you have any lab test that is abnormal or we need to change your treatment, we will call you to review the results.  Testing/Procedures: Your physician has recommended that you have a sleep study. This test records several body functions during sleep, including: brain activity, eye movement, oxygen and carbon dioxide blood levels, heart rate and rhythm, breathing rate and rhythm, the flow of air through your mouth and nose, snoring, body muscle movements, and chest and belly movement.  Your physician has requested that you have an echocardiogram. Echocardiography is a painless test that uses sound waves to create images of your heart. It provides your doctor with information about the size and shape of your heart and how well your heart's chambers and valves are working. You may receive an ultrasound enhancing agent through an IV if needed to better visualize your heart during the echo.This procedure takes approximately one hour. There are no restrictions for this procedure. This will take place at the [address], [address].   Follow-Up: At [facility], you and your health needs are our priority.  As part of our continuing mission to provide you with exceptional heart care, we have created designated Provider Care Teams.  These Care Teams include your primary Cardiologist (physician) and Advanced Practice Providers (APPs -  Physician Assistants and Nurse Practitioners) who all work together to provide you with the care you need, when you need it.  Your next appointment:   6  month(s)  The format for your next appointment:   In Person  Provider:   Elouise Munroe, MD

## 2022-09-23 NOTE — Progress Notes (Signed)
Cardiology Office Note:    Date:  09/23/2022   ID:  Elizabeth Fuentes, DOB 01/23/1956, MRN [id number]  PCP:  Nickola Major, MD  Cardiologist:  Elouise Munroe, MD  Electrophysiologist:  None   Referring MD: Nickola Major, MD   Chief Complaint/Reason for Referral: Follow up Fhx of CAD  History of Present Illness:    Elizabeth Fuentes is a 66 y.o. female with a history of history of chest pressure with normal stress nuclear study with excellent exercise capacity in 2004 and 2011, and strong family history of coronary artery disease who presents for risk stratification. Last seen 10/09/20 by me for initial consultation. Brother had aorta replacement.   Previously discussed results of calcium scoring CT, no coronary calcium and normal size of visualized aorta. Tiny bilateral pulmonary nodules noted, she is low risk and no follow up imaging is required by Fleischner criteria. She had covid in January '21, she wonders if this is related. Unclear given no prior imaging to review. Recommend monitoring clinically.   Today, she is currently dealing with a sinus infection. She tested for COVID and was negative. Since her last appointment she has been doing well for the most part. She does state that every once in awhile she will wake up in the middle of the night feeling a pressure in her chest. She describes it feels like a heavy weight is on her chest. It did have some SOB associated with these episodes. When she wakes she will sit up and take a few deep breaths and it will improve/ resolve and she is able to return to sleeping. It has happened maybe three times since her last visit. The last episode was about a month ago. One episode did occur during the day where she had to suddenly take a deep breath while she was walking and then it resolved. She denied any palpitations during these episodes. Also of note these episodes started prior to her current sinus infection.   She does experience some  snoring at night but has never had a sleep study done and has not been diagnosed with sleep apnea. She has tried snore strips for her snoring and did not tolerate them. Her husband has never told her that she stops breathing while she sleeps.  We discussed the benefits of a sleep study to rule out sleep apnea as well repeating an echocardiogram.   She exercises regularly. Twice a week she takes a boxing class. She does not experience any SOB or chest pressure while exercising.   She denies any palpitations or peripheral edema. No lightheadedness, headaches, syncope, orthopnea, or PND.   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   [EXPIRED] clotrimazole (MYCELEX) 10 MG troche Take by mouth.   [EXPIRED] fluconazole (DIFLUCAN) 100 MG tablet Take by mouth.   [DISCONTINUED] Turmeric 400 MG CAPS Take by mouth.   Current Facility-Administered Medications for the 09/23/22 encounter (Office Visit) with Elouise Munroe, MD  Medication   triamcinolone acetonide (KENALOG) 10 MG/ML injection 10 mg     Allergies:   Sulfamethoxazole   Social History   Tobacco Use  Smoking status: Never   Smokeless tobacco: Never  Substance Use Topics   Alcohol use: Never   Drug use: Never     Family History: The patient's family history includes Aortic aneurysm in her brother; Atrial fibrillation in her sister; Heart attack in her father.  ROS:   Please see the history of present illness.   (+) chest pressure  (+) SOB  All other systems reviewed and are negative.  EKGs/Labs/Other Studies Reviewed:    The following studies were reviewed today:  Coronary Calcium Scoring  10/28/2020: FINDINGS: Non-cardiac: See separate report from [facility].   Ascending Aorta: 32 mm at the mid ascending aorta measured in an axial plane.   Pericardium: Normal   Coronary arteries:   Coronary calcium score of 0.   IMPRESSION: Coronary calcium score of 0.    EKG:  09/23/2022: Sinus rhythm 70 bpm.    Recent Labs: No results found for requested labs within last 365 days.  Recent Lipid Panel No results found for: "CHOL", "TRIG", "HDL", "CHOLHDL", "VLDL", "LDLCALC", "LDLDIRECT"  Physical Exam:    VS:  BP (!) 115/58   Pulse 79   Ht '5\' 2"'$  (1.575 m)   Wt 160 lb (72.6 kg)   SpO2 98%   BMI 29.26 kg/m     Wt Readings from Last 5 Encounters:  10/20/22 158 lb (71.7 kg)  09/23/22 160 lb (72.6 kg)  11/05/20 159 lb 9.6 oz (72.4 kg)  10/09/20 158 lb 6.4 oz (71.8 kg)    Constitutional: No acute distress Eyes: sclera non-icteric, normal conjunctiva and lids ENMT: normal dentition, moist mucous membranes Cardiovascular: regular rhythm, normal rate, no murmurs. S1 and S2 normal.  No jugular venous distention.  Respiratory: clear to auscultation bilaterally GI : normal bowel sounds, soft and nontender. No distention.   MSK: extremities warm, well perfused. No edema.  NEURO: grossly nonfocal exam, moves all extremities. PSYCH: alert and oriented x 3, normal mood and affect.   ASSESSMENT:    1. Chest pressure   2. Snoring     PLAN:    Chest pressure - Plan: EKG 12-Lead, ECHOCARDIOGRAM COMPLETE  Snoring - Plan: Home sleep test   We reviewed 0 calcium score, and the fact that she does not have symptoms while exercising.  Atypical symptoms and usually while resting or sleeping, will evaluate for sleep apnea and obtain an echocardiogram to screen.  Low threshold to obtain a coronary CTA if chest pressure progresses.  She snores and will need a sleep study to ensure that it is not apnea contributing to her symptoms. No recent cholesterol numbers to review.       Total time of encounter: 30 minutes total time of encounter, including 20 minutes spent in face-to-face patient care on the date of this encounter. This time includes coordination of care and counseling regarding above mentioned problem list. Remainder of non-face-to-face time involved reviewing chart documents/testing relevant to the patient encounter and documentation in the medical record. I have independently reviewed documentation from referring provider.   Cherlynn Kaiser, MD, [place]    Follow Up: 6 months  Medication Adjustments/Labs and Tests Ordered: Current medicines are reviewed at length with the patient today.  Concerns regarding medicines are outlined above.   Orders Placed This Encounter  Procedures   EKG 12-Lead   ECHOCARDIOGRAM COMPLETE   Home sleep test    No orders of the defined types were placed in this encounter.   Patient Instructions  Medication  Instructions:  No Changes In Medications at this time.  *If you need a refill on your cardiac medications before your next appointment, please call your pharmacy*  Lab Work: None Ordered At This Time.  If you have labs (blood work) drawn today and your tests are completely normal, you will receive your results only by: [place] (if you have MyChart) OR A paper copy in the mail If you have any lab test that is abnormal or we need to change your treatment, we will call you to review the results.  Testing/Procedures: Your physician has recommended that you have a sleep study. This test records several body functions during sleep, including: brain activity, eye movement, oxygen and carbon dioxide blood levels, heart rate and rhythm, breathing rate and rhythm, the flow of air through your mouth and nose, snoring, body muscle movements, and chest and belly movement.  Your physician has requested that you have an echocardiogram. Echocardiography is a painless test that uses sound  waves to create images of your heart. It provides your doctor with information about the size and shape of your heart and how well your heart's chambers and valves are working. You may receive an ultrasound enhancing agent through an IV if needed to better visualize your heart during the echo.This procedure takes approximately one hour. There are no restrictions for this procedure. This will take place at the [address].   Follow-Up: At [facility], you and your health needs are our priority.  As part of our continuing mission to provide you with exceptional heart care, we have created designated Provider Care Teams.  These Care Teams include your primary Cardiologist (physician) and Advanced Practice Providers (APPs -  Physician Assistants and Nurse Practitioners) who all work together to provide you with the care you need, when you need it.  Your next appointment:   6 month(s)  The format for your next appointment:   In Person  Provider:   Elouise Munroe, MD       Burnett Kanaris Ford,acting as a scribe for Elouise Munroe, MD.,have documented all relevant documentation on the behalf of Elouise Munroe, MD,as directed by  Elouise Munroe, MD while in the presence of Elouise Munroe, MD.   I, Elouise Munroe, MD, have reviewed all documentation for the visit on 09/23/2022. The documentation on today's date of service for the exam, diagnosis, procedures, and orders are all accurate and complete.

## 2022-10-07 ENCOUNTER — Ambulatory Visit (HOSPITAL_COMMUNITY): Payer: PPO | Attending: Internal Medicine

## 2022-10-07 DIAGNOSIS — R0789 Other chest pain: Secondary | ICD-10-CM | POA: Diagnosis present

## 2022-10-07 LAB — ECHOCARDIOGRAM COMPLETE
Area-P 1/2: 3.22 cm2
P 1/2 time: 335 msec
S' Lateral: 2.4 cm

## 2022-10-20 ENCOUNTER — Ambulatory Visit (HOSPITAL_BASED_OUTPATIENT_CLINIC_OR_DEPARTMENT_OTHER): Payer: PPO | Attending: Internal Medicine | Admitting: Cardiology

## 2022-10-20 VITALS — Ht 62.0 in | Wt 158.0 lb

## 2022-10-20 DIAGNOSIS — G4733 Obstructive sleep apnea (adult) (pediatric): Secondary | ICD-10-CM | POA: Insufficient documentation

## 2022-10-20 DIAGNOSIS — R0683 Snoring: Secondary | ICD-10-CM

## 2022-10-22 ENCOUNTER — Encounter (HOSPITAL_BASED_OUTPATIENT_CLINIC_OR_DEPARTMENT_OTHER): Payer: PPO | Admitting: Cardiology

## 2022-10-22 NOTE — Procedures (Signed)
   Patient Name: Elizabeth Fuentes, Elizabeth Fuentes Date: 10/20/2022 Gender: Female D.O.B: 1956/10/09 Age (years): 84 Referring Provider: Cherlynn Kaiser MD Height (inches): 23 Interpreting Physician: Fransico Him MD, ABSM Weight (lbs): 160 RPSGT: Jacolyn Reedy BMI: 29 MRN: [id number] Neck Size: 14.00  CLINICAL INFORMATION Sleep Study Type: HST   Indication for sleep study: N/A  Epworth Sleepiness Score: N/A  SLEEP STUDY TECHNIQUE A multi-channel overnight portable sleep study was performed. The channels recorded were: nasal airflow, thoracic respiratory movement, and oxygen saturation with a pulse oximetry. Snoring was also monitored.  MEDICATIONS Patient self administered medications include: N/A.  SLEEP ARCHITECTURE Patient was studied for 405 minutes. The sleep efficiency was 99.0 % and the patient was supine for 0%. The arousal index was 0.0 per hour.  RESPIRATORY PARAMETERS The overall AHI was 9.5 per hour, with a central apnea index of 0 per hour.  The oxygen nadir was 87% during sleep.  CARDIAC DATA Mean heart rate during sleep was 62.3 bpm.  IMPRESSIONS - Mild obstructive sleep apnea occurred during this study (AHI = 9.5/h). - Mild oxygen desaturation was noted during this study (Min O2 = 87%). - Patient snored 9.4% during the sleep.  DIAGNOSIS - Obstructive Sleep Apnea (G47.33)  RECOMMENDATIONS - Therapeutic CPAP titration to determine optimal pressure required to alleviate sleep disordered breathing. - Positional therapy avoiding supine position during sleep. - Oral appliance may be considered. - Avoid alcohol, sedatives and other CNS depressants that may worsen sleep apnea and disrupt normal sleep architecture. - Sleep hygiene should be reviewed to assess factors that may improve sleep quality. - Weight management and regular exercise should be initiated or continued. - Return to Sleep Center to discuss the results of this study  [Electronically signed]  10/22/2022 08:16 AM  Fransico Him MD, ABSM Diplomate, American Board of Sleep Medicine

## 2022-10-26 ENCOUNTER — Encounter (HOSPITAL_BASED_OUTPATIENT_CLINIC_OR_DEPARTMENT_OTHER): Payer: PPO | Admitting: Cardiology

## 2022-11-09 ENCOUNTER — Telehealth: Payer: Self-pay | Admitting: *Deleted

## 2022-11-09 NOTE — Telephone Encounter (Signed)
-----   Message from Lauralee Evener, [place] sent at 10/25/2022  9:38 AM EST -----  ----- Message ----- From: Sueanne Margarita, MD Sent: 10/22/2022   8:18 AM EST To: Cv Div Sleep Studies  Patient has very mild OSA - set up OV to discuss treatment options.  Patient has very mild OSA - set up OV to discuss treatment options.

## 2022-11-09 NOTE — Telephone Encounter (Signed)
The patient has been notified of the result and verbalized understanding.  All questions (if any) were answered. Marolyn Hammock, [place] 11/09/2022 4:29 PM    Patient wants to think about whether she wants to set up an office visit or not she will think about it through the holidays and call us back later.

## 2024-11-07 ENCOUNTER — Ambulatory Visit: Attending: Internal Medicine | Admitting: Internal Medicine

## 2024-11-07 ENCOUNTER — Encounter: Payer: Self-pay | Admitting: Internal Medicine

## 2024-11-07 VITALS — BP 133/81 | HR 78 | Ht 62.0 in | Wt 159.4 lb

## 2024-11-07 DIAGNOSIS — E663 Overweight: Secondary | ICD-10-CM | POA: Diagnosis not present

## 2024-11-07 DIAGNOSIS — Z8249 Family history of ischemic heart disease and other diseases of the circulatory system: Secondary | ICD-10-CM

## 2024-11-07 DIAGNOSIS — Z7189 Other specified counseling: Secondary | ICD-10-CM | POA: Diagnosis not present

## 2024-11-07 DIAGNOSIS — G4733 Obstructive sleep apnea (adult) (pediatric): Secondary | ICD-10-CM | POA: Diagnosis not present

## 2024-11-07 NOTE — Patient Instructions (Signed)
 Medication Instructions:  No Changes *If you need a refill on your cardiac medications before your next appointment, please call your pharmacy*  Lab Work: None  Follow-Up: At [facility], you and your health needs are our priority.  As part of our continuing mission to provide you with exceptional heart care, our providers are all part of one team.  This team includes your primary Cardiologist (physician) and Advanced Practice Providers or APPs (Physician Assistants and Nurse Practitioners) who all work together to provide you with the care you need, when you need it.  Your next appointment:   1 year(s) (We will mail a reminder letter around Sept 2026; please call for a November 2026 appointment)  Provider:   Gayatri A Acharya, MD   Other Instructions Please call us  or send a MyChart message with any Cardiology related questions/concerns.  [phone].  Thank you!

## 2024-11-07 NOTE — Progress Notes (Signed)
 Cardiology Office Note:  .   Date:  11/07/2024  ID:  Elizabeth Fuentes, DOB Dec 15, 1956, MRN [id number] PCP: Leila Lucie LABOR, MD  [facility] Providers Cardiologist:  Soyla LABOR Merck, MD    History of Present Illness: .   Elizabeth Fuentes is a 68 y.o. female.  Discussed the use of AI scribe software for clinical note transcription with the patient, who gave verbal consent to proceed.  History of Present Illness Elizabeth Fuentes is a 68 year old female who presents for a cardiovascular follow-up and family history review.  She has a significant family history of cardiovascular disease, including an older brother with aortic replacement, a younger brother with carotid artery surgery, and two sisters with atrial fibrillation, one of whom also has cardiomyopathy.  She maintains an active lifestyle, training with a trainer three days a week and walking regularly. She focuses on dietary changes, increasing protein intake and reducing added sugars. She has lost nine pounds but has regained some weight. She uses protein shakes and Greek yogurt to meet her dietary goals.  Previous cardiac evaluations include a nuclear stress test and a calcium scoring CT, both with normal results and a zero calcium score. Her last echocardiogram in 2023 was normal.  A sleep study indicated borderline results for sleep apnea, but she has not pursued treatment. Her husband notes she snores when congested, managed with over-the-counter medication.  She receives B12 shots every two weeks for energy but missed the last dose due to travel and caregiving responsibilities. Her last cholesterol check in 2023 showed an LDL of 90, triglycerides of 60, and HDL of 79. She plans to have her cholesterol checked again during her upcoming yearly checkup.    ROS: negative except per HPI above.  Studies Reviewed: SABRA   EKG Interpretation Date/Time:  Wednesday November 07 2024 14:32:28 EST Ventricular Rate:  74 PR  Interval:  174 QRS Duration:  82 QT Interval:  396 QTC Calculation: 439 R Axis:   66  Text Interpretation: Normal sinus rhythm Normal ECG No previous ECGs available Confirmed by Merck Soyla ([id number]) on 11/07/2024 3:10:40 PM    Results LABS LDL: 90 (2023) Triglycerides: 60 (2023) HDL: 79 (2023)  RADIOLOGY Calcium scoring CT: Calcium score 0  DIAGNOSTIC Echocardiogram: Normal (2023) EKG: Normal (11/07/2024) Risk Assessment/Calculations:       Physical Exam:   VS:  BP 133/81   Pulse 78   Ht 5' 2 (1.575 m)   Wt 159 lb 6.4 oz (72.3 kg)   SpO2 98%   BMI 29.15 kg/m    Wt Readings from Last 3 Encounters:  11/07/24 159 lb 6.4 oz (72.3 kg)  10/20/22 158 lb (71.7 kg)  09/23/22 160 lb (72.6 kg)     Physical Exam MEASUREMENTS: Height- 5'2. GENERAL: Alert, cooperative, well developed, no acute distress. HEENT: Normocephalic, normal oropharynx, moist mucous membranes. CHEST: Clear to auscultation bilaterally, no wheezes, rhonchi, or crackles. CARDIOVASCULAR: Normal heart rate and rhythm, S1 and S2 normal without murmurs. ABDOMEN: Soft, non-tender, non-distended, without organomegaly, normal bowel sounds. EXTREMITIES: No cyanosis or edema. NEUROLOGICAL: Cranial nerves grossly intact, moves all extremities without gross motor or sensory deficit.   ASSESSMENT AND PLAN: .    Assessment and Plan Assessment & Plan Obstructive sleep apnea, mild Mild obstructive sleep apnea. No current CPAP or oral appliance use. - Continue positional therapy to avoid sleeping on back. - Consider oral appliance if symptoms worsen.  Cardiac risk counseling Overweight by BMI Recent nine-pound weight loss with regular  exercise and dietary modifications. Emphasized protein intake and limiting sugars and fats. - Continue regular exercise regimen. - Increase protein intake to 20-30 grams -three-four times a day. Renal function is normal. - Limit added sugars to less than 20 grams per day. -  Consider consulting a local nutritionist for dietary guidance.  Family history of ischemic heart disease and other circulatory system diseases Family history includes ischemic heart disease and surgeries in siblings. Previous cardiac evaluations normal. Regular cholesterol monitoring with good results. - Continue regular cholesterol monitoring. - Will consider calcium scoring CT in 2026 if needed. - Maintain healthy lifestyle to mitigate cardiovascular risk.  Recording duration: 21 minutes      Calliope Delangel, MD, FACC

## 2025-01-03 ENCOUNTER — Ambulatory Visit: Admitting: Internal Medicine
